# Patient Record
Sex: Female | Born: 1955 | Race: White | Hispanic: No | Marital: Married | Smoking: Former smoker
Health system: Southern US, Community
[De-identification: ages and names within clinical notes are randomized; demographics above are authoritative.]

## PROBLEM LIST (undated history)

## (undated) DIAGNOSIS — R7303 Prediabetes: Secondary | ICD-10-CM

## (undated) DIAGNOSIS — E559 Vitamin D deficiency, unspecified: Secondary | ICD-10-CM

## (undated) DIAGNOSIS — E781 Pure hyperglyceridemia: Secondary | ICD-10-CM

## (undated) HISTORY — DX: Vitamin D deficiency, unspecified: E55.9

## (undated) HISTORY — DX: Pure hyperglyceridemia: E78.1

## (undated) HISTORY — DX: Prediabetes: R73.03

---

## 2008-12-10 ENCOUNTER — Ambulatory Visit: Payer: Self-pay

## 2009-12-22 ENCOUNTER — Ambulatory Visit: Payer: Self-pay | Admitting: Family Medicine

## 2011-08-26 ENCOUNTER — Ambulatory Visit: Payer: Self-pay | Admitting: Family Medicine

## 2013-01-10 ENCOUNTER — Ambulatory Visit: Payer: Self-pay

## 2013-02-12 ENCOUNTER — Ambulatory Visit: Payer: Self-pay

## 2014-03-11 ENCOUNTER — Ambulatory Visit: Payer: Self-pay

## 2017-02-11 ENCOUNTER — Telehealth: Payer: Self-pay

## 2017-02-11 NOTE — Telephone Encounter (Signed)
l mom to call and schedule NP appt, per [facility], for abnormal ekg.

## 2017-04-12 ENCOUNTER — Encounter: Payer: Self-pay | Admitting: Internal Medicine

## 2017-04-12 ENCOUNTER — Ambulatory Visit (INDEPENDENT_AMBULATORY_CARE_PROVIDER_SITE_OTHER): Payer: Self-pay | Admitting: Internal Medicine

## 2017-04-12 VITALS — BP 138/86 | HR 50 | Ht 67.0 in | Wt 184.8 lb

## 2017-04-12 DIAGNOSIS — R0789 Other chest pain: Secondary | ICD-10-CM

## 2017-04-12 DIAGNOSIS — E781 Pure hyperglyceridemia: Secondary | ICD-10-CM | POA: Insufficient documentation

## 2017-04-12 DIAGNOSIS — R9431 Abnormal electrocardiogram [ECG] [EKG]: Secondary | ICD-10-CM

## 2017-04-12 NOTE — Patient Instructions (Signed)
Medication Instructions:  Your physician recommends that you continue on your current medications as directed. Please refer to the Current Medication list given to you today.   Labwork: none  Testing/Procedures: Your physician has requested that you have a stress echocardiogram. For further information please visit [email or web address] Please follow instruction sheet as given.    Follow-Up: Your physician recommends that you schedule a follow-up appointment ON AN AS NEEDED BASIS.     Exercise Stress Echocardiogram An exercise stress echocardiogram is a test that checks how well your heart is working. For this test, you will walk on a treadmill to make your heart beat faster. This test uses sound waves (ultrasound) and a computer to make pictures (images) of your heart. These pictures will be taken before you exercise and after you exercise. What happens before the procedure?  Follow instructions from your doctor about what you cannot eat or drink before the test.  Do not drink or eat anything that has caffeine in it. Stop having caffeine for 24 hours before the test.  Ask your doctor about changing or stopping your normal medicines. This is important if you take diabetes medicines or blood thinners. Ask your doctor if you should take your medicines with water before the test.  If you use an inhaler, bring it to the test.  Do not use any products that have nicotine or tobacco in them, such as cigarettes and e-cigarettes. Stop using them for 4 hours before the test. If you need help quitting, ask your doctor.  Wear comfortable shoes and clothing. What happens during the procedure?  You will be hooked up to a TV screen. Your doctor will watch the screen to see how fast your heart beats during the test.  Before you exercise, a computer will make a picture of your heart. To do this: ? A gel will be put on your chest. ? A wand will be moved over the gel. ? Sound waves from the wand  will go to the computer to make the picture.  Your will start walking on a treadmill. The treadmill will start at a slow speed. It will get faster a little bit at a time. When you walk faster, your heart will beat faster.  The treadmill will be stopped when your heart is working hard.  You will lie down right away so another picture of your heart can be taken.  The test will take 30-60 minutes. What happens after the procedure?  Your heart rate and blood pressure will be watched after the test.  If your doctor says that you can, you may: ? Eat what you usually eat. ? Do your normal activities. ? Take medicines like normal. Summary  An exercise stress echocardiogram is a test that checks how well your heart is working.  Follow instructions about what you cannot eat or drink before the test. Ask your doctor if you should take your normal medicines before the test.  Stop having caffeine for 24 hours before the test. Do not use anything with nicotine or tobacco in it for 4 hours before the test.  A computer will take a picture of your heart before you walk on a treadmill. It will take another picture when you are done walking.  Your heart rate and blood pressure will be watched after the test. This information is not intended to replace advice given to you by your health care provider. Make sure you discuss any questions you have with your health care provider. Document  Released: 06/13/2009 Document Revised: 05/09/2016 Document Reviewed: 05/09/2016 Elsevier Interactive Patient Education  2017 Elsevier Inc.    Ranitidine tablets or capsules What is this medicine? RANITIDINE (ra NYE te deen) is a type of antihistamine that blocks the release of stomach acid. It is used to treat stomach or intestinal ulcers. It can relieve ulcer pain and discomfort, and the heartburn from acid reflux. This medicine may be used for other purposes; ask your health care provider or pharmacist if you have  questions. COMMON BRAND NAME(S): Acid Reducer, Ranitidine, Taladine, Wal-Zan, Zantac, Zantac 150, Zantac 75 What should I tell my health care provider before I take this medicine? They need to know if you have any of these conditions: -kidney disease -liver disease -porphyria -an unusual or allergic reaction to ranitidine, other medicines, foods, dyes, or preservatives -pregnant or trying to get pregnant -breast-feeding How should I use this medicine? Take this medicine by mouth with a glass of water. Follow the directions on the prescription label. If you only take this medicine once a day, take it at bedtime. Take your medicine at regular intervals. Do not take your medicine more often than directed. Do not stop taking except on your doctor's advice. Talk to your pediatrician regarding the use of this medicine in children. Special care may be needed. Overdosage: If you think you have taken too much of this medicine contact a poison control center or emergency room at once. NOTE: This medicine is only for you. Do not share this medicine with others. What if I miss a dose? If you miss a dose, take it as soon as you can. If it is almost time for your next dose, take only that dose. Do not take double or extra doses. What may interact with this medicine? -atazanavir -delavirdine -gefitinib -glipizide -ketoconazole -midazolam -procainamide -propantheline -triazolam -warfarin This list may not describe all possible interactions. Give your health care provider a list of all the medicines, herbs, non-prescription drugs, or dietary supplements you use. Also tell them if you smoke, drink alcohol, or use illegal drugs. Some items may interact with your medicine. What should I watch for while using this medicine? Tell your doctor or health care professional if your condition does not start to get better or gets worse. You may need to take this medicine for several days as prescribed before your  symptoms get better. Finish the full course of tablets prescribed, even if you feel better. Do not smoke cigarettes or drink alcohol. These increase irritation in your stomach and can lengthen the time it will take for ulcers to heal. Cigarettes and alcohol can also make acid reflux or heartburn worse. If you get black, tarry stools or vomit up what looks like coffee grounds, call your doctor or health care professional at once. You may have a bleeding ulcer. What side effects may I notice from receiving this medicine? Side effects that you should report to your doctor or health care professional as soon as possible: -agitation, nervousness, depression, hallucinations -allergic reactions like skin rash, itching or hives, swelling of the face, lips, or tongue -breast enlargement in both males and females -breathing problems -redness, blistering, peeling or loosening of the skin, including inside the mouth -unusual bleeding or bruising -unusually weak or tired -vomiting -yellowing of the skin or eyes Side effects that usually do not require medical attention (report to your doctor or health care professional if they continue or are bothersome): -constipation or diarrhea -dizziness -headache -nausea This list may not describe  all possible side effects. Call your doctor for medical advice about side effects. You may report side effects to FDA at 1-800-FDA-1088. Where should I keep my medicine? Keep out of the reach of children. Store at room temperature between 15 and 30 degrees C (59 and 86 degrees F). Protect from light and moisture. Keep container tightly closed. Throw away any unused medicine after the expiration date. NOTE: This sheet is a summary. It may not cover all possible information. If you have questions about this medicine, talk to your doctor, pharmacist, or health care provider.  2018 Elsevier/Gold Standard (2012-12-06 14:50:34)   Famotidine tablets or gelcaps What is this  medicine? FAMOTIDINE (fa MOE ti deen) is a type of antihistamine that blocks the release of stomach acid. It is used to treat stomach or intestinal ulcers. It can also relieve heartburn from acid reflux. This medicine may be used for other purposes; ask your health care provider or pharmacist if you have questions. COMMON BRAND NAME(S): Heartburn Relief, Pepcid, Pepcid AC, Pepcid AC Maximum Strength What should I tell my health care provider before I take this medicine? They need to know if you have any of these conditions: -kidney or liver disease -trouble swallowing -an unusual or allergic reaction to famotidine, other medicines, foods, dyes, or preservatives -pregnant or trying to get pregnant -breast-feeding How should I use this medicine? Take this medicine by mouth with a glass of water. Follow the directions on the prescription label. If you only take this medicine once a day, take it at bedtime. Take your doses at regular intervals. Do not take your medicine more often than directed. Talk to your pediatrician regarding the use of this medicine in children. Special care may be needed. Overdosage: If you think you have taken too much of this medicine contact a poison control center or emergency room at once. NOTE: This medicine is only for you. Do not share this medicine with others. What if I miss a dose? If you miss a dose, take it as soon as you can. If it is almost time for your next dose, take only that dose. Do not take double or extra doses. What may interact with this medicine? -delavirdine -itraconazole -ketoconazole This list may not describe all possible interactions. Give your health care provider a list of all the medicines, herbs, non-prescription drugs, or dietary supplements you use. Also tell them if you smoke, drink alcohol, or use illegal drugs. Some items may interact with your medicine. What should I watch for while using this medicine? Tell your doctor or health  care professional if your condition does not start to get better or if it gets worse. Finish the full course of tablets prescribed, even if you feel better. Do not take with aspirin, ibuprofen or other antiinflammatory medicines. These can make your condition worse. Do not smoke cigarettes or drink alcohol. These cause irritation in your stomach and can increase the time it will take for ulcers to heal. If you get black, tarry stools or vomit up what looks like coffee grounds, call your doctor or health care professional at once. You may have a bleeding ulcer. What side effects may I notice from receiving this medicine? Side effects that you should report to your doctor or health care professional as soon as possible: -agitation, nervousness -confusion -hallucinations -skin rash, itching Side effects that usually do not require medical attention (report to your doctor or health care professional if they continue or are bothersome): -constipation -diarrhea -dizziness -headache  This list may not describe all possible side effects. Call your doctor for medical advice about side effects. You may report side effects to FDA at 1-800-FDA-1088. Where should I keep my medicine? Keep out of the reach of children. Store at room temperature between 15 and 30 degrees C (59 and 86 degrees F). Do not freeze. Throw away any unused medicine after the expiration date. NOTE: This sheet is a summary. It may not cover all possible information. If you have questions about this medicine, talk to your doctor, pharmacist, or health care provider.  2018 Elsevier/Gold Standard (2012-12-06 14:45:49)

## 2017-04-12 NOTE — Progress Notes (Signed)
New Outpatient Visit Date: 04/12/2017  Referring Provider: Leanna Sato, MD [address] [place], [place] [address]  Chief Complaint: Heartburn and abnormal EKG  HPI:  Angela Stanton is a 61 y.o. female who is being seen today for the evaluation of abnormal EKG at the request of Dr. Marvis Moeller. She has a history of hyperlipidemia, prediabetes, and vitamin D deficiency. She was evaluated by Dr. Marvis Moeller in June, in January, which time routine EKG was notable for nonspecific ST changes in the inferolateral leads. Referral to cardiology was made at the time, though the patient deferred this due to financial constraints. She was most recently seen by Dr. Marvis Moeller in June, at which time cardiology consultation was again recommended. Angela Stanton therefore presents today with her daughter, who is a Engineer, civil (consulting) at [employer].  [name] only complaint is of frequent heartburn that she describes as a burning sensation in her chest after eating. It happens on an almost daily basis. She takes Tums and Rolaids regularly with some relief. She was on Nexium in the past with good control of her heartburn. However, she has not taken this for over a year due to concern for potential side effects. Aside from this, Angela Stanton has not had any chest pain, shortness of breath, palpitations, lightheadedness, orthopnea, PND, or edema. 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 She eats many of her meals there. She denies a history of cardiac disease or prior cardiovascular testing.  --------------------------------------------------------------------------------------------------  Cardiovascular History & Procedures: Cardiovascular Problems:  Atypical chest pain  Abnormal EKG  Risk Factors:  Hyperlipidemia, prediabetes, and family history  Cath/PCI:  None  CV Surgery:  None  EP Procedures and Devices:  None  Non-Invasive  Evaluation(s):  None  --------------------------------------------------------------------------------------------------  Past Medical History:  Diagnosis Date  . Hypertriglyceridemia   . Prediabetes   . Vitamin D deficiency     History reviewed. No pertinent surgical history.  Current Meds  Medication Sig  . Biotin 1000 MCG tablet Take 1,000 mcg by mouth 3 (three) times daily.  . calcium carbonate (TUMS - DOSED IN MG ELEMENTAL CALCIUM) 500 MG chewable tablet Chew 1 tablet by mouth daily.  . Ergocalciferol (VITAMIN D2 PO) Take by mouth.  . meloxicam (MOBIC) 15 MG tablet Take 15 mg by mouth daily.  . Multiple Vitamin (MULTIVITAMIN) capsule Take 1 capsule by mouth daily.  . Omega-3 Fatty Acids (FISH OIL PO) Take 1,000 mg by mouth every morning.  . Potassium (POTASSIMIN PO) Take by mouth.  . Red Yeast Rice Extract (RED YEAST RICE PO) Take by mouth.    Allergies: Patient has no allergy information on record.  Social History   Social History  . Marital status: Married    Spouse name: N/A  . Number of children: N/A  . Years of education: N/A   Occupational History  . Not on file.   Social History Main Topics  . Smoking status: Former Smoker    Packs/day: 0.20    Years: 1.00    Types: Cigarettes    Quit date: 86  . Smokeless tobacco: Never Used  . Alcohol use Yes     Comment: drink once a month  . Drug use: No  . Sexual activity: Not on file   Other Topics Concern  . Not on file   Social History Narrative  . No narrative on file    Family History  Problem Relation Age of Onset  . Lung cancer  Mother [id number]  . Alcohol abuse Father   . Pancreatic disease Father   . Diabetes Father   . Other Sister [id number]       Found dead; presumed heart attack but no autopsy done  . Heart disease Brother 7560       CAD and stent    Review of Systems: A 12-system review of systems was performed and was negative except as noted in the  HPI.  --------------------------------------------------------------------------------------------------  Physical Exam: BP 138/86 (BP Location: Left Arm, Patient Position: Sitting)   Pulse (!) 50   Ht 5\' 7"  (1.702 m)   Wt 184 lb 12 oz (83.8 kg)   BMI 28.94 kg/m   General:  Overweight woman, seated comfortably in the exam room. She is accompanied by her daughter. HEENT: No conjunctival pallor or scleral icterus. Moist mucous membranes. OP clear. Neck: Supple without lymphadenopathy, thyromegaly, JVD, or HJR. No carotid bruit. Lungs: Normal work of breathing. Clear to auscultation bilaterally without wheezes or crackles. Heart: Bradycardic but regular without murmurs, rubs, or gallops. Non-displaced PMI. Abd: Bowel sounds present. Soft, NT/ND without hepatosplenomegaly Ext: No lower extremity edema. Radial, PT, and DP pulses are 2+ bilaterally Skin: Warm and dry without rash. Neuro: CNIII-XII intact. Strength and fine-touch sensation intact in upper and lower extremities bilaterally. Psych: Normal mood and affect.  EKG:  Sinus bradycardia with nonspecific ST-T changes. Appearance is not significantly different compared with prior tracing from 08/31/16.  Outside labs (02/01/17): Lipid panel: Total cholesterol 233, triglyceride 593, HDL 38, LDL unable to calculate  Hemoglobin A1c: 5.7  --------------------------------------------------------------------------------------------------  ASSESSMENT AND PLAN: Atypical chest pain and abnormal EKG [name] has long-standing chest pain that she attributes to heartburn. The description of her symptoms is suggestive of acid reflux. However, she has nonspecific ST segment changes on her EKGs performed today and in January. Cardiac risk factors include hyperlipidemia, prediabetes, and family history. We have therefore agreed to obtain an exercise stress echocardiogram for further evaluation. I have suggested that [name] try an over-the-counter  antacid such as ranitidine or famotidine for management of her acid reflux.  Hypertriglyceridemia Significant hypertriglyceridemia was noted on recent lipid panel. I encouraged [name] to continue with fish oil. I will defer further management to Dr. Marvis MoellerMiles.  Follow-up: Return to clinic as needed based on results of exercise stress echocardiogram.  Yvonne Kendallhristopher Shun Pletz, MD 04/12/2017 8:23 PM

## 2017-04-13 NOTE — Addendum Note (Signed)
Addended by: Margrett RudSLAYTON, BRITTANY N on: 04/13/2017 04:36 PM   Modules accepted: Orders

## 2017-05-06 ENCOUNTER — Telehealth: Payer: Self-pay | Admitting: *Deleted

## 2017-05-06 NOTE — Telephone Encounter (Signed)
No answer. Left detailed message with time and date of stress echo on 05/09/17 at 0830 am and pre-procedural instructions.

## 2017-05-09 ENCOUNTER — Ambulatory Visit (INDEPENDENT_AMBULATORY_CARE_PROVIDER_SITE_OTHER): Payer: Self-pay

## 2017-05-09 DIAGNOSIS — R9431 Abnormal electrocardiogram [ECG] [EKG]: Secondary | ICD-10-CM

## 2017-05-09 DIAGNOSIS — R0789 Other chest pain: Secondary | ICD-10-CM

## 2017-05-09 LAB — ECHOCARDIOGRAM STRESS TEST
CHL CUP MPHR: 161 {beats}/min
CSEPED: 6 min
CSEPEW: 7.6 METS
Exercise duration (sec): 25 s
Peak HR: 144 {beats}/min
Percent HR: 89 %
Rest HR: 65 {beats}/min

## 2019-09-14 ENCOUNTER — Other Ambulatory Visit: Payer: Self-pay

## 2019-09-14 NOTE — Progress Notes (Signed)
Phoned patient to prescreen for [facility] appointment scheduled 09/18/19.  Left message. Prescreened patient for 09/18/19 [facility] visit using two patient identifiers.  She denies any breast problems. Patient will go directly to [facility] for mammogram. Does not want to be scheduled for pap smear.  Will discuss need for pap with Dr. Marvis Moeller, her primary provider.  Orders are in.

## 2019-09-18 ENCOUNTER — Ambulatory Visit
Admission: RE | Admit: 2019-09-18 | Discharge: 2019-09-18 | Disposition: A | Discharge status: Home / Self Care | Payer: Self-pay | Source: Ambulatory Visit | Attending: Oncology | Admitting: Oncology

## 2019-09-18 ENCOUNTER — Encounter: Payer: Self-pay | Admitting: *Deleted

## 2019-09-18 ENCOUNTER — Ambulatory Visit: Payer: Self-pay | Attending: Oncology | Admitting: *Deleted

## 2019-09-18 DIAGNOSIS — Z0000 Encounter for general adult medical examination without abnormal findings: Secondary | ICD-10-CM

## 2019-09-18 NOTE — Progress Notes (Signed)
  Subjective:     Patient ID: Angela Stanton, female   DOB: 1955/12/30, 64 y.o.   MRN: [id number]  HPI   Review of Systems     Objective:   Physical Exam     Assessment:     Due to Covid 19 pandemic a televisit was used to enroll patient into our [facility]CP program and collect patient's health history.  Patient denies any breast problems.  Per Everrett Coombe note patient is due for a pap smear, but would like to discuss with her primary care provider.  She has refused pap through [facility]CP at this time.  Patient presented directly to the [facility] for her mammogram today.  Patient has been screened for eligibility.  She does not have any insurance, Medicare or Medicaid.  She also meets financial eligibility.   See Dondra Spry model breast cancer risk assessment below.  Risk Assessment    Risk Scores      09/18/2019   Last edited by: Scarlett Presto, RN   5-year risk: 1 %   Lifetime risk: 4.7 %            Plan:     Screening mammogram ordered.  Will follow up per [facility] protocol.

## 2019-09-20 ENCOUNTER — Encounter: Payer: Self-pay | Admitting: *Deleted

## 2019-09-20 NOTE — Progress Notes (Signed)
Letter mailed from the [facility] to inform patient of her normal mammogram results.  Patient is to follow-up with annual screening in one year. 

## 2020-09-22 NOTE — Progress Notes (Signed)
Patient pre-screened for [facility] eligibility due to COVID 19 precautions. Two patient identifiers used for verification that I was speaking to correct patient.  Patient to Present directly to [facility] 09/23/20 at 9:00 a.m. for [facility]CP screening mammogram. Risk Assessment    Risk Scores      09/23/2020 09/18/2019   Last edited by: Scarlett Presto, RN Scarlett Presto, RN   5-year risk: 1 % 1 %   Lifetime risk: 4.6 % 4.7 %

## 2020-09-23 ENCOUNTER — Other Ambulatory Visit: Payer: Self-pay

## 2020-09-23 ENCOUNTER — Ambulatory Visit: Payer: Self-pay | Attending: Oncology

## 2020-09-23 ENCOUNTER — Ambulatory Visit
Admission: RE | Admit: 2020-09-23 | Discharge: 2020-09-23 | Disposition: A | Discharge status: Home / Self Care | Payer: Self-pay | Source: Ambulatory Visit | Attending: Oncology | Admitting: Oncology

## 2020-09-23 DIAGNOSIS — Z0000 Encounter for general adult medical examination without abnormal findings: Secondary | ICD-10-CM

## 2020-10-02 NOTE — Progress Notes (Signed)
Letter mailed from [facility] to notify of normal mammogram results.  Patient to return in one year for annual screening.  Copy to [facility]. 

## 2021-02-01 IMAGING — MG DIGITAL SCREENING BILAT W/ TOMO W/ CAD
8 series · 8 of 24 positions shown · non-contrast
Comparison: Previous exam(s).

CLINICAL DATA: Screening.

EXAM:
DIGITAL SCREENING BILATERAL MAMMOGRAM WITH TOMO AND CAD

[L MLO synth-2D]
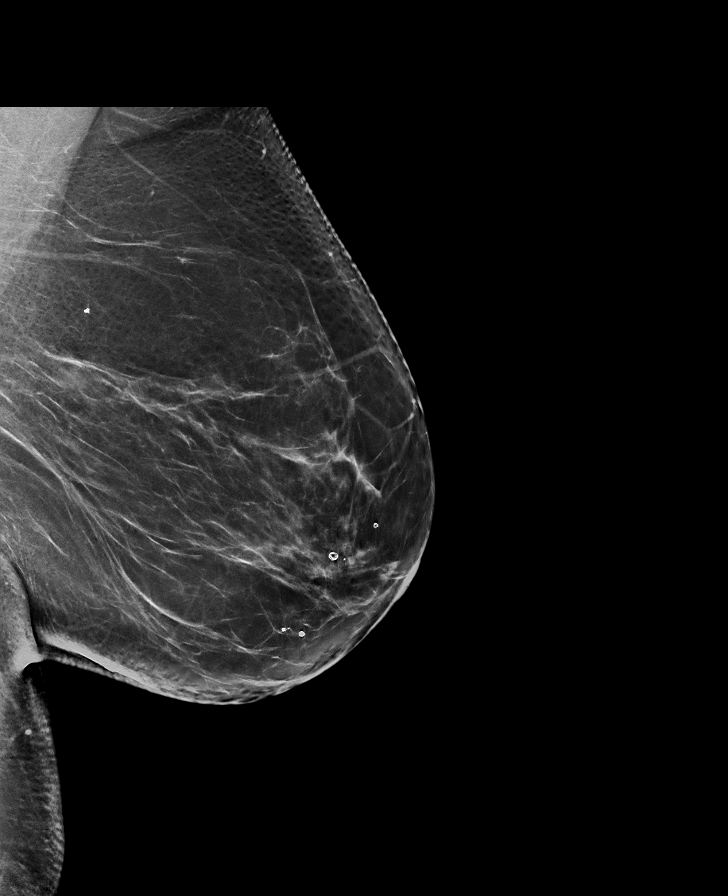

[R CC synth-2D]
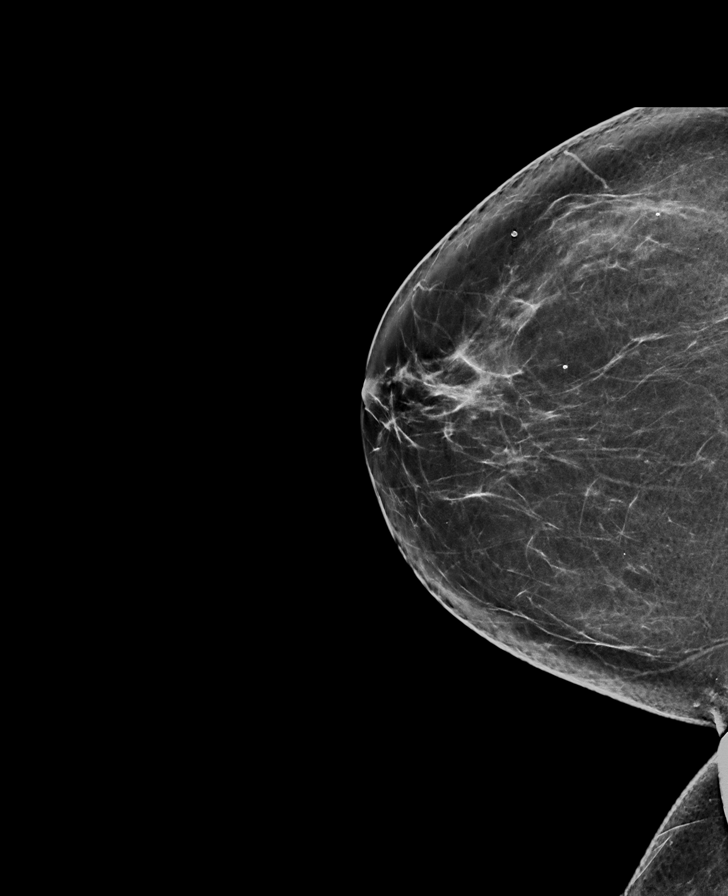

[L CC synth-2D]
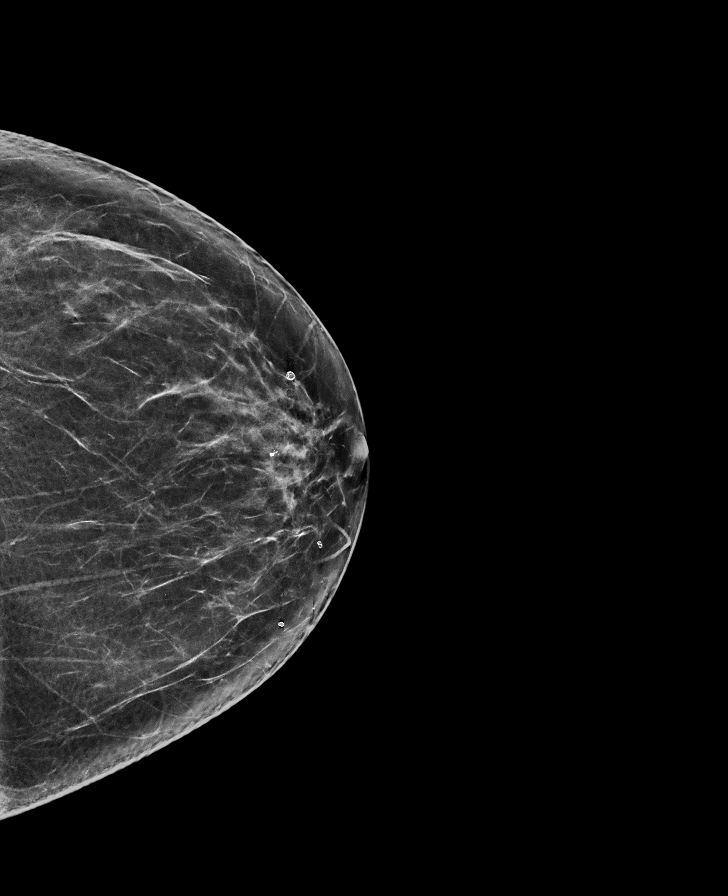

[R MLO synth-2D]
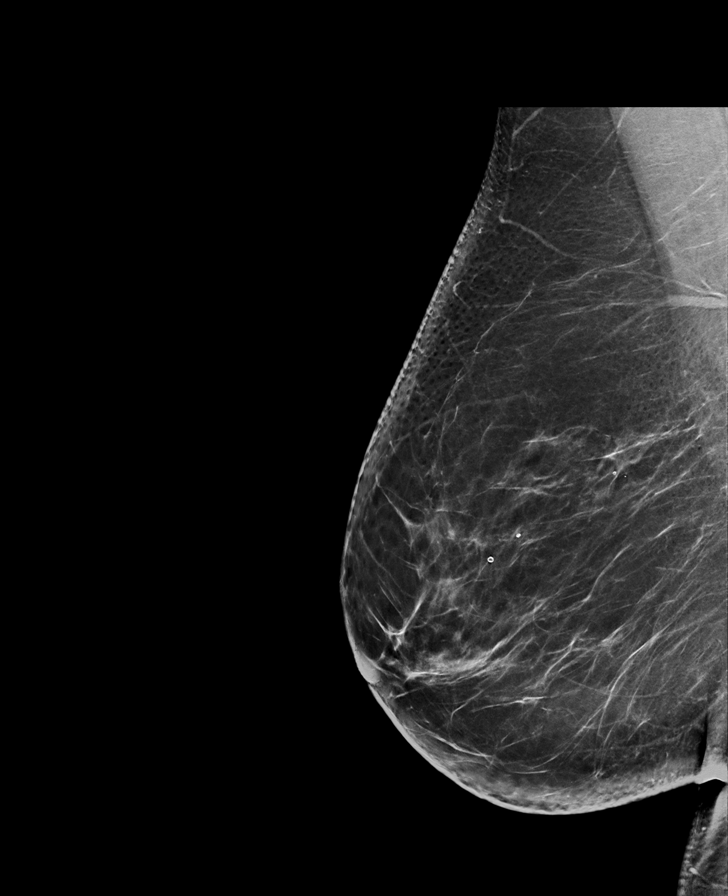

[L MLO tomo · tomo slice 46/91.0]
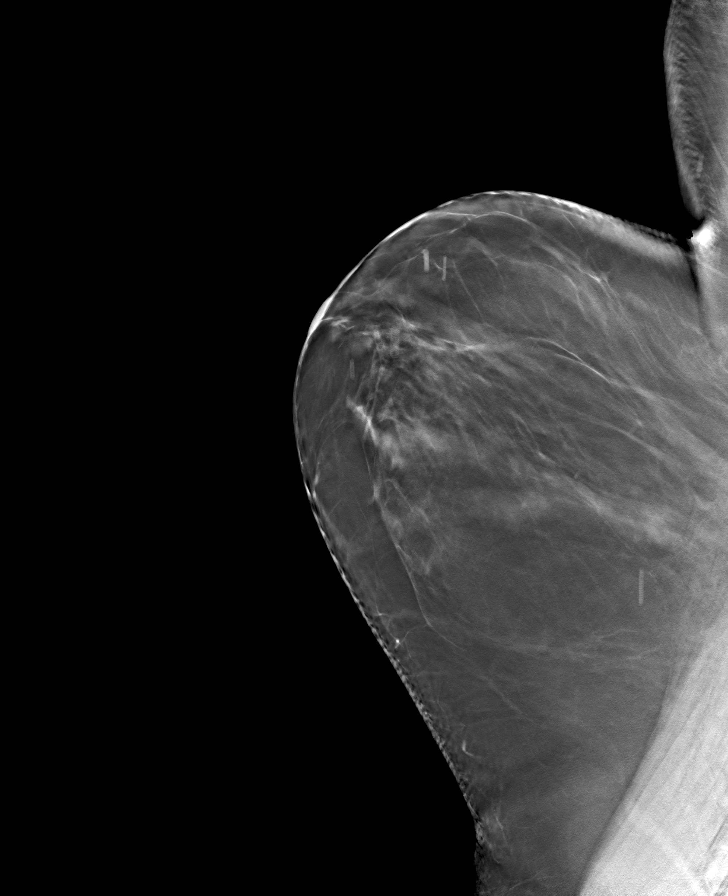

[R CC tomo · tomo slice 39/78.0]
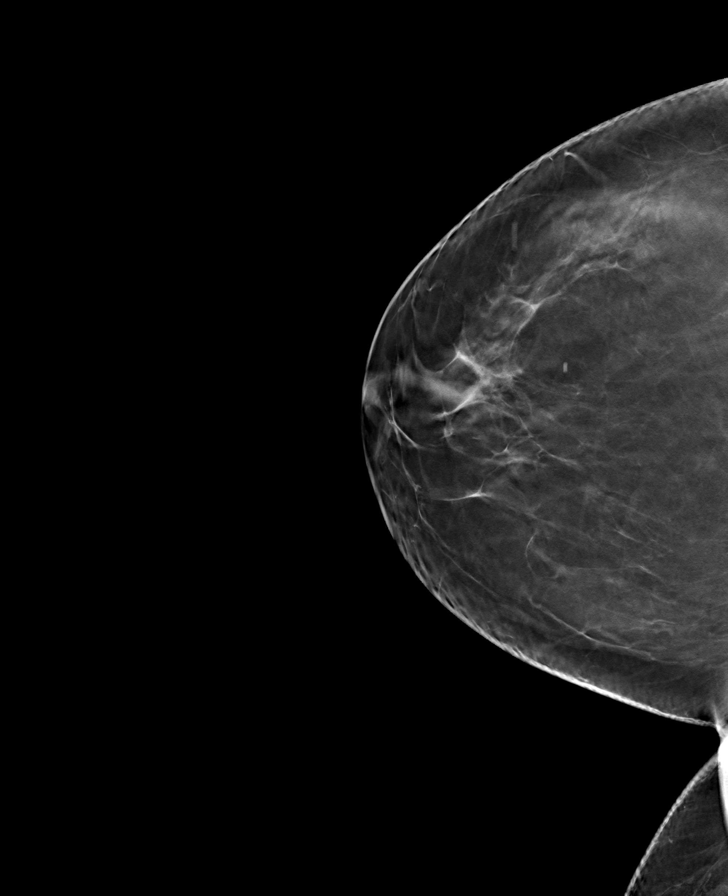

[L CC tomo · tomo slice 37/74.0]
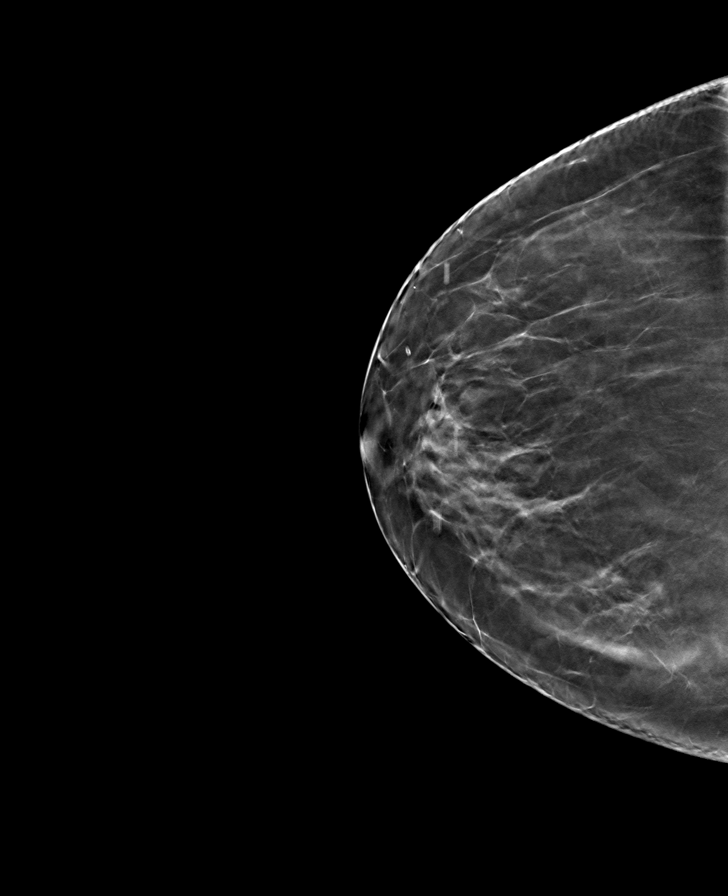

[R MLO tomo · tomo slice 44/87.0]
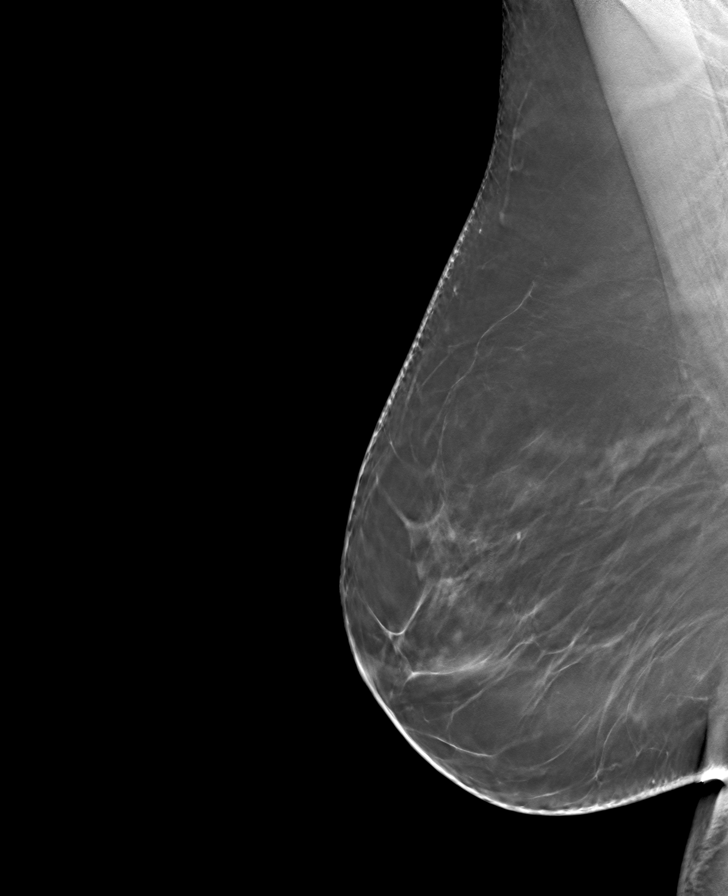

[8 of 24 positions shown; findings below may reference images not displayed]

ACR Breast Density Category b: There are scattered areas of
fibroglandular density.
FINDINGS: There are no findings suspicious for malignancy. Images were
processed with CAD.
IMPRESSION: No mammographic evidence of malignancy. A result letter of this
screening mammogram will be mailed directly to the patient.

RECOMMENDATION:
Screening mammogram in one year. (Code:[id number])

BI-RADS CATEGORY  1: Negative.

## 2021-09-01 ENCOUNTER — Other Ambulatory Visit: Payer: Self-pay | Admitting: Family Medicine

## 2021-09-16 ENCOUNTER — Other Ambulatory Visit: Payer: Self-pay

## 2021-09-16 DIAGNOSIS — Z1231 Encounter for screening mammogram for malignant neoplasm of breast: Secondary | ICD-10-CM

## 2021-10-19 ENCOUNTER — Other Ambulatory Visit: Payer: Self-pay

## 2021-10-19 DIAGNOSIS — Z1211 Encounter for screening for malignant neoplasm of colon: Secondary | ICD-10-CM

## 2021-10-21 ENCOUNTER — Ambulatory Visit
Admission: RE | Admit: 2021-10-21 | Discharge: 2021-10-21 | Disposition: A | Discharge status: Home / Self Care | Payer: Self-pay | Source: Ambulatory Visit | Attending: Obstetrics and Gynecology | Admitting: Obstetrics and Gynecology

## 2021-10-21 ENCOUNTER — Other Ambulatory Visit: Payer: Self-pay

## 2021-10-21 ENCOUNTER — Ambulatory Visit: Payer: Self-pay | Attending: Oncology | Admitting: *Deleted

## 2021-10-21 VITALS — BP 148/75 | HR 64 | Resp 18 | Wt 190.0 lb

## 2021-10-21 DIAGNOSIS — Z1239 Encounter for other screening for malignant neoplasm of breast: Secondary | ICD-10-CM

## 2021-10-21 DIAGNOSIS — Z1231 Encounter for screening mammogram for malignant neoplasm of breast: Secondary | ICD-10-CM | POA: Insufficient documentation

## 2021-10-21 NOTE — Progress Notes (Addendum)
[name] is a 66 y.o. female who presents to [place] [facility] today with no complaints.    Pap Smear: Pap smear not completed today. Last Pap smear was 3 years ago at a clinic in [place] and was normal per patient. Patient stated she is unsure of the clinic name she had her Pap smear completed. Per patient has no history of an abnormal Pap smear. Last Pap smear result is not available in Epic.   Physical exam: Breasts Left breast slightly larger than right breast that per patient is normal for her. No skin abnormalities bilateral breasts. No nipple retraction bilateral breasts. No nipple discharge bilateral breasts. No lymphadenopathy. No lumps palpated bilateral breasts. Complaints of bilateral nipple area tenderness on exam.   MS DIGITAL SCREENING TOMO BILATERAL  Result Date: 10/21/2021 CLINICAL DATA:  Screening. EXAM: DIGITAL SCREENING BILATERAL MAMMOGRAM WITH TOMOSYNTHESIS AND CAD TECHNIQUE: Bilateral screening digital craniocaudal and mediolateral oblique mammograms were obtained. Bilateral screening digital breast tomosynthesis was performed. The images were evaluated with computer-aided detection. COMPARISON:  Previous exam(s). ACR Breast Density Category b: There are scattered areas of fibroglandular density. FINDINGS: There are no findings suspicious for malignancy. IMPRESSION: No mammographic evidence of malignancy. A result letter of this screening mammogram will be mailed directly to the patient. RECOMMENDATION: Screening mammogram in one year. (Code:SM-B-01Y) BI-RADS CATEGORY  1: Negative. Electronically Signed   By: Elberta Fortis M.D.   On: 10/21/2021 13:20   MS DIGITAL SCREENING TOMO BILATERAL  Result Date: 09/23/2020 CLINICAL DATA:  Screening. EXAM: DIGITAL SCREENING BILATERAL MAMMOGRAM WITH TOMO AND CAD COMPARISON:  Previous exam(s). ACR Breast Density Category b: There are scattered areas of fibroglandular density. FINDINGS: There are no findings suspicious for malignancy. The  images were evaluated with computer-aided detection. IMPRESSION: No mammographic evidence of malignancy. A result letter of this screening mammogram will be mailed directly to the patient. RECOMMENDATION: Screening mammogram in one year. (Code:SM-B-01Y) BI-RADS CATEGORY  1: Negative. Electronically Signed   By: Edwin Cap M.D.   On: 09/23/2020 12:41   MS DIGITAL SCREENING TOMO BILATERAL  Result Date: 09/19/2019 CLINICAL DATA:  Screening. EXAM: DIGITAL SCREENING BILATERAL MAMMOGRAM WITH TOMO AND CAD COMPARISON:  Previous exam(s). ACR Breast Density Category b: There are scattered areas of fibroglandular density. FINDINGS: There are no findings suspicious for malignancy. Images were processed with CAD. IMPRESSION: No mammographic evidence of malignancy. A result letter of this screening mammogram will be mailed directly to the patient. RECOMMENDATION: Screening mammogram in one year. (Code:SM-B-01Y) BI-RADS CATEGORY  1: Negative. Electronically Signed   By: Baird Lyons M.D.   On: 09/19/2019 08:36    Pelvic/Bimanual Patient is due for Pap smear. Patient refused Pap smear.    Smoking History: Patient is a former smoker that quit 38 years ago.   Patient Navigation: Patient education provided. Access to services provided for patient through [facility] program.   Colorectal Cancer Screening: Per patient has never had colonoscopy completed. FIT Test given to patient to complete. No complaints today.    Breast and Cervical Cancer Risk Assessment: Patient does not have family history of breast cancer, known genetic mutations, or radiation treatment to the chest before age 49. Patient does not have history of cervical dysplasia, immunocompromised, or DES exposure in-utero.  Risk Assessment     Risk Scores       10/21/2021 09/23/2020   Last edited by: Lesle Chris, RN Scarlett Presto, RN   5-year risk: 1 % 1 %   Lifetime risk:  4.4 % 4.6 %            A: [facility] exam without pap smear No  complaints.  P: Referred patient to the [facility] for a screening mammogram. Appointment scheduled Wednesday, October 21, 2021 at 1300.  Priscille Heidelberg, RN 10/21/2021 12:32 PM

## 2021-10-21 NOTE — Patient Instructions (Signed)
Explained breast self awareness with Kem Parkinson. Patient is due for Pap smear. Patient refused Pap smear. Let her know [facility] will cover Pap smears every 3 years unless has a history of abnormal Pap smears. Referred patient to the [facility] for a screening mammogram. Appointment scheduled Wednesday, October 21, 2021 at 1300. Patient aware of appointment and will be there. Let patient know [facility] will follow up with her within the next couple weeks with results of mammogram by letter or phone. Kem Parkinson verbalized understanding.  Khrystal Jeanmarie, Kathaleen Maser, RN 12:32 PM

## 2022-09-29 ENCOUNTER — Other Ambulatory Visit: Payer: Self-pay

## 2022-09-29 DIAGNOSIS — Z1231 Encounter for screening mammogram for malignant neoplasm of breast: Secondary | ICD-10-CM

## 2022-11-01 ENCOUNTER — Ambulatory Visit: Payer: Self-pay | Attending: Hematology and Oncology | Admitting: Hematology and Oncology

## 2022-11-01 ENCOUNTER — Ambulatory Visit
Admission: RE | Admit: 2022-11-01 | Discharge: 2022-11-01 | Disposition: A | Discharge status: Home / Self Care | Payer: Self-pay | Source: Ambulatory Visit | Attending: Obstetrics and Gynecology | Admitting: Obstetrics and Gynecology

## 2022-11-01 VITALS — BP 140/99 | Wt 185.5 lb

## 2022-11-01 DIAGNOSIS — Z1231 Encounter for screening mammogram for malignant neoplasm of breast: Secondary | ICD-10-CM

## 2022-11-01 NOTE — Progress Notes (Signed)
[name] is a 67 y.o. female who presents to [facility] clinic today with no complaints.    Pap Smear: Pap not smear completed today. Last Pap smear was 2020 at [facility] and was normal. Per patient has no history of an abnormal Pap smear. Last Pap smear result is not available in Epic. She was advised by her PCP that she no longer needs Pap smears and declines future testing.    Physical exam: Breasts Breasts symmetrical. No skin abnormalities bilateral breasts. No nipple retraction bilateral breasts. No nipple discharge bilateral breasts. No lymphadenopathy. No lumps palpated bilateral breasts.       Pelvic/Bimanual Pap is not indicated today    Smoking History: Patient has never smoked and was not referred to quit line.    Patient Navigation: Patient education provided. Access to services provided for patient through [facility] program. No interpreter provided. No transportation provided   Colorectal Cancer Screening: Per patient has never had colonoscopy completed No complaints today. Will follow up FIT testing at [facility].    Breast and Cervical Cancer Risk Assessment: Patient has family history of breast cancer with her sister. Patient does not have history of cervical dysplasia, immunocompromised, or DES exposure in-utero.  Risk Assessment   No risk assessment data for the current encounter  Risk Scores       10/21/2021   Last edited by: Drue Dun, RN   5-year risk: 1 %   Lifetime risk: 4.4 %            A: [facility] exam without pap smear No complaints with benign exam.   P: [facility]. Appointment scheduled 11/01/22.  Dayton Scrape A, NP 11/01/2022 10:21 AM

## 2022-11-01 NOTE — Patient Instructions (Addendum)
Taught Angela Stanton about self breast awareness and gave educational materials to take home. Patient did not need a Pap smear today due to last Pap smear was in 2020  per patient.  [facility]. Appointment scheduled for 11/01/22. Patient aware of appointment and will be there. Let patient know will follow up with her within the next couple weeks with results. Angela Stanton verbalized understanding.  Melodye Ped, NP 9:54 AM

## 2023-09-30 ENCOUNTER — Encounter: Payer: Self-pay | Admitting: Family Medicine

## 2023-09-30 DIAGNOSIS — Z1231 Encounter for screening mammogram for malignant neoplasm of breast: Secondary | ICD-10-CM

## 2023-10-03 ENCOUNTER — Other Ambulatory Visit: Payer: Self-pay

## 2023-10-03 DIAGNOSIS — Z1231 Encounter for screening mammogram for malignant neoplasm of breast: Secondary | ICD-10-CM

## 2023-11-14 ENCOUNTER — Other Ambulatory Visit: Payer: Self-pay

## 2023-11-14 ENCOUNTER — Ambulatory Visit
Admission: RE | Admit: 2023-11-14 | Discharge: 2023-11-14 | Disposition: A | Discharge status: Home / Self Care | Payer: Self-pay | Source: Ambulatory Visit | Attending: Obstetrics and Gynecology | Admitting: Obstetrics and Gynecology

## 2023-11-14 ENCOUNTER — Ambulatory Visit: Payer: Self-pay | Attending: Hematology and Oncology | Admitting: Hematology and Oncology

## 2023-11-14 VITALS — BP 152/89 | Wt 183.3 lb

## 2023-11-14 DIAGNOSIS — Z1231 Encounter for screening mammogram for malignant neoplasm of breast: Secondary | ICD-10-CM | POA: Insufficient documentation

## 2023-11-14 DIAGNOSIS — Z1211 Encounter for screening for malignant neoplasm of colon: Secondary | ICD-10-CM

## 2023-11-14 NOTE — Patient Instructions (Signed)
 Taught Angela Stanton how to perform BSE and gave educational materials to take home. Patient did not need a Pap smear today due to last Pap smear was in 2020 per patient. Patient is 58 and no longer needs Pap smears. Referred patient to [facility] for diagnostic mammogram. Appointment scheduled for 11/14/2023. Patient aware of appointment and will be there. Let patient know will follow up with her within the next couple weeks with results. Angela Stanton verbalized understanding.  Pascal Lux, NP 10:12 AM

## 2023-11-14 NOTE — Progress Notes (Signed)
 Angela Stanton is a 68 y.o. female who presents to [facility] clinic today with no complaints.    Pap Smear: Pap not smear completed today. Last Pap smear was 2020 at [facility] and was normal. Per patient has no history of an abnormal Pap smear. Last Pap smear result is available in Epic.   Physical exam: Breasts Breasts symmetrical. No skin abnormalities bilateral breasts. No nipple retraction bilateral breasts. No nipple discharge bilateral breasts. No lymphadenopathy. No lumps palpated bilateral breasts.       Pelvic/Bimanual Pap is not indicated today    Smoking History: Patient has is a former smoker having quit in 1985 and was not referred to quit line.    Patient Navigation: Patient education provided. Access to services provided for patient through [facility] program. No interpreter provided. No transportation provided   Colorectal Cancer Screening: Per patient has never had colonoscopy completed No complaints today. FIT test given.    Breast and Cervical Cancer Risk Assessment: Patient does not have family history of breast cancer, known genetic mutations, or radiation treatment to the chest before age 41. Patient does not have history of cervical dysplasia, immunocompromised, or DES exposure in-utero.  Risk Scores as of Encounter on 11/14/2023     Angela Stanton           5-year 2.81%   Lifetime 10.42%            Last calculated by Narda Rutherford, LPN on 12/06/8117 at  9:46 AM        A: [facility] exam without pap smear No complaints with benign exam.   P: Referred patient to the [facility] [place] for a screening mammogram. Appointment scheduled 11/14/2023.  Angela Stanton A, NP 11/14/2023 10:10 AM
